# Patient Record
Sex: Female | Born: 1995 | Race: White | Hispanic: No | Marital: Single | Smoking: Never smoker
Health system: Southern US, Community
[De-identification: ages and names within clinical notes are randomized; demographics above are authoritative.]

## PROBLEM LIST (undated history)

## (undated) DIAGNOSIS — I471 Supraventricular tachycardia: Secondary | ICD-10-CM

---

## 2015-12-10 ENCOUNTER — Encounter: Payer: Self-pay | Admitting: Emergency Medicine

## 2015-12-10 ENCOUNTER — Other Ambulatory Visit: Payer: Self-pay

## 2015-12-10 ENCOUNTER — Emergency Department: Payer: 59

## 2015-12-10 ENCOUNTER — Emergency Department
Admission: EM | Admit: 2015-12-10 | Discharge: 2015-12-10 | Disposition: A | Discharge status: Home / Self Care | Payer: 59 | Attending: Emergency Medicine | Admitting: Emergency Medicine

## 2015-12-10 DIAGNOSIS — Z638 Other specified problems related to primary support group: Secondary | ICD-10-CM | POA: Insufficient documentation

## 2015-12-10 DIAGNOSIS — R0602 Shortness of breath: Secondary | ICD-10-CM | POA: Diagnosis present

## 2015-12-10 DIAGNOSIS — F419 Anxiety disorder, unspecified: Secondary | ICD-10-CM | POA: Diagnosis not present

## 2015-12-10 DIAGNOSIS — F439 Reaction to severe stress, unspecified: Secondary | ICD-10-CM

## 2015-12-10 DIAGNOSIS — I471 Supraventricular tachycardia: Secondary | ICD-10-CM | POA: Insufficient documentation

## 2015-12-10 DIAGNOSIS — F410 Panic disorder [episodic paroxysmal anxiety] without agoraphobia: Secondary | ICD-10-CM

## 2015-12-10 HISTORY — DX: Supraventricular tachycardia: I47.1

## 2015-12-10 LAB — BASIC METABOLIC PANEL
Anion gap: 7 (ref 5–15)
BUN: 17 mg/dL (ref 6–20)
CO2: 25 mmol/L (ref 22–32)
Calcium: 9.1 mg/dL (ref 8.9–10.3)
Chloride: 103 mmol/L (ref 101–111)
Creatinine, Ser: 0.73 mg/dL (ref 0.44–1.00)
GFR calc Af Amer: >60 mL/min (ref >60)
GFR calc non Af Amer: >60 mL/min (ref >60)
GLUCOSE: 94 mg/dL (ref 65–99)
POTASSIUM: 3.7 mmol/L (ref 3.5–5.1)
Sodium: 135 mmol/L (ref 135–145)

## 2015-12-10 LAB — TROPONIN I: Troponin I: <0.03 ng/mL (ref <0.031)

## 2015-12-10 LAB — CBC
HEMATOCRIT: 42.5 % (ref 35.0–47.0)
HEMOGLOBIN: 14.5 g/dL (ref 12.0–16.0)
MCH: 31.5 pg (ref 26.0–34.0)
MCHC: 34.2 g/dL (ref 32.0–36.0)
MCV: 92 fL (ref 80.0–100.0)
PLATELETS: 270 10*3/uL (ref 150–440)
RBC: 4.61 MIL/uL (ref 3.80–5.20)
RDW: 12.8 % (ref 11.5–14.5)
WBC: 12.9 10*3/uL — ABNORMAL HIGH (ref 3.6–11.0)

## 2015-12-10 MED ORDER — ALPRAZOLAM 0.25 MG PO TABS: 0.2500 mg | ORAL_TABLET | Freq: Three times a day (TID) | ORAL | Status: AC | PRN

## 2015-12-10 NOTE — ED Notes (Signed)
Pt presents to ED with complaints of shortness of breath, lightheadedness and dizziness. Pt states has been under a lot of stress recently and not sleeping well. Pt states slept very well last night and woke this morning feeling the same way. Pt states has a history of SVT so has been concerned.

## 2015-12-10 NOTE — ED Provider Notes (Signed)
[facility] Emergency Department Provider Note  ____________________________________________  Time seen: Approximately 11:08 AM  I have reviewed the triage vital signs and the nursing notes.   HISTORY  Chief Complaint Shortness of Breath    HPI Dominique Gay is a 20 y.o. female presents for evaluation of shortness of breath lightheadedness dizziness or chest pains. Past medical history of SVT so she is concerned about that. Patient states that she's been under a lot of stress at school lately and not sleeping well. Patient does report that she had been asleep last night but however woke up feeling anxious and stressed out. Denies any nausea vomiting numbness tingling or radiation of the chest pain.   Past Medical History  Diagnosis Date  . SVT (supraventricular tachycardia) (HCC)     There are no active problems to display for this patient.   History reviewed. No pertinent past surgical history.  Current Outpatient Rx  Name  Route  Sig  Dispense  Refill  . ALPRAZolam (XANAX) 0.25 MG tablet   Oral   Take 1 tablet (0.25 mg total) by mouth 3 (three) times daily as needed for anxiety.   30 tablet   0     Allergies Review of patient's allergies indicates no known allergies.  No family history on file.  Social History Social History  Substance Use Topics  . Smoking status: Never Smoker   . Smokeless tobacco: None  . Alcohol Use: Yes     Comment: occasional    Review of Systems Constitutional: No fever/chills Eyes: No visual changes. ENT: No sore throat. Cardiovascular: Positive chest pain. Respiratory: Positive shortness of breath. Gastrointestinal: No abdominal pain.  No nausea, no vomiting.  No diarrhea.  No constipation. Genitourinary: Negative for dysuria. Musculoskeletal: Negative for back pain. Skin: Negative for rash. Neurological: Negative for headaches, focal weakness or numbness. Positive for anxiety.  10-point ROS otherwise  negative.  ____________________________________________   PHYSICAL EXAM:  VITAL SIGNS: ED Triage Vitals  Enc Vitals Group     BP 12/10/15 0929 114/69 mmHg     Pulse Rate 12/10/15 0929 87     Resp 12/10/15 0929 15     Temp 12/10/15 0929 98.7 F (37.1 C)     Temp Source 12/10/15 0929 Oral     SpO2 12/10/15 0929 100 %     Weight 12/10/15 0929 110 lb (49.896 kg)     Height 12/10/15 0929 5\' 7"  (1.702 m)     Head Cir --      Peak Flow --      Pain Score --      Pain Loc --      Pain Edu? --      Excl. in GC? --     Constitutional: Alert and oriented. Well appearing and in no acute distress. Eyes: Conjunctivae are normal. PERRL. EOMI. Head: Atraumatic. Nose: No congestion/rhinnorhea. Mouth/Throat: Mucous membranes are moist.  Oropharynx non-erythematous. Neck: No stridor.   Cardiovascular: Normal rate, regular rhythm. Grossly normal heart sounds.  Good peripheral circulation. Respiratory: Normal respiratory effort.  No retractions. Lungs CTAB. Musculoskeletal: No lower extremity tenderness nor edema.  No joint effusions. Neurologic:  Normal speech and language. No gross focal neurologic deficits are appreciated. No gait instability. Skin:  Skin is warm, dry and intact. No rash noted. Psychiatric: Mood and affect are normal. Speech and behavior are normal.  ____________________________________________   LABS (all labs ordered are listed, but only abnormal results are displayed)  Labs Reviewed  CBC -  Abnormal; Notable for the following:    WBC 12.9 (*)    All other components within normal limits  BASIC METABOLIC PANEL  TROPONIN I   ____________________________________________  EKG  Normal sinus rhythm, no acute STEMI. ____________________________________________  RADIOLOGY  FINDINGS: Heart and mediastinal contours are within normal limits. No focal opacities or effusions. No acute bony abnormality.  IMPRESSION: No active cardiopulmonary  disease. ____________________________________________   PROCEDURES  Procedure(s) performed: None  Critical Care performed: No  ____________________________________________   INITIAL IMPRESSION / ASSESSMENT AND PLAN / ED COURSE  Pertinent labs & imaging results that were available during my care of the patient were reviewed by me and considered in my medical decision making (see chart for details).  Situational stress anxiety. Rx given for Xanax 0.25 mg 3 times a day as needed for anxiety. Establish local health care provider follow-up with student health. Patient voices no other emergency medical complaints at this time.  Reassurance given that chest pain or discomfort is not cardiac in nature. ____________________________________________   FINAL CLINICAL IMPRESSION(S) / ED DIAGNOSES  Final diagnoses:  Anxiety attack  Stress at home     This chart was dictated using voice recognition software/Dragon. Despite best efforts to proofread, errors can occur which can change the meaning. Any change was purely unintentional.   Evangeline Dakin, PA-C 12/10/15 1125

## 2015-12-10 NOTE — Discharge Instructions (Signed)
Stress and Stress Management °Stress is a normal reaction to life events. It is what you feel when life demands more than you are used to or more than you can handle. Some stress can be useful. For example, the stress reaction can help you catch the last bus of the day, study for a test, or meet a deadline at work. But stress that occurs too often or for too long can cause problems. It can affect your emotional health and interfere with relationships and normal daily activities. Too much stress can weaken your immune system and increase your risk for physical illness. If you already have a medical problem, stress can make it worse. °CAUSES  °All sorts of life events may cause stress. An event that causes stress for one person may not be stressful for another person. Major life events commonly cause stress. These may be positive or negative. Examples include losing your job, moving into a new home, getting married, having a baby, or losing a loved one. Less obvious life events may also cause stress, especially if they occur day after day or in combination. Examples include working long hours, driving in traffic, caring for children, being in debt, or being in a difficult relationship. °SIGNS AND SYMPTOMS °Stress may cause emotional symptoms including, the following: °· Anxiety. This is feeling worried, afraid, on edge, overwhelmed, or out of control. °· Anger. This is feeling irritated or impatient. °· Depression. This is feeling sad, down, helpless, or guilty. °· Difficulty focusing, remembering, or making decisions. °Stress may cause physical symptoms, including the following:  °· Aches and pains. These may affect your head, neck, back, stomach, or other areas of your body. °· Tight muscles or clenched jaw. °· Low energy or trouble sleeping.  °Stress may cause unhealthy behaviors, including the following:  °· Eating to feel better (overeating) or skipping meals. °· Sleeping too little, too much, or both. °· Working  too much or putting off tasks (procrastination). °· Smoking, drinking alcohol, or using drugs to feel better. °DIAGNOSIS  °Stress is diagnosed through an assessment by your health care provider. Your health care provider will ask questions about your symptoms and any stressful life events. Your health care provider will also ask about your medical history and may order blood tests or other tests. Certain medical conditions and medicine can cause physical symptoms similar to stress.  Mental illness can cause emotional symptoms and unhealthy behaviors similar to stress. Your health care provider may refer you to a mental health professional for further evaluation.  °TREATMENT  °Stress management is the recommended treatment for stress. The goals of stress management are reducing stressful life events and coping with stress in healthy ways.  °Techniques for reducing stressful life events include the following: °· Stress identification. Self-monitor for stress and identify what causes stress for you. These skills may help you to avoid some stressful events. °· Time management. Set your priorities, keep a calendar of events, and learn to say "no." These tools can help you avoid making too many commitments. °Techniques for coping with stress include the following: °· Rethinking the problem. Try to think realistically about stressful events rather than ignoring them or overreacting. Try to find the positives in a stressful situation rather than focusing on the negatives. °· Exercise. Physical exercise can release both physical and emotional tension. The key is to find a form of exercise you enjoy and do it regularly. °· Relaxation techniques. These relax the body and mind. Examples include yoga, meditation, tai chi, biofeedback, deep   breathing, progressive muscle relaxation, listening to music, being out in nature, journaling, and other hobbies. Again, the key is to find one or more that you enjoy and can do  regularly.  Healthy lifestyle. Eat a balanced diet, get plenty of sleep, and do not smoke. Avoid using alcohol or drugs to relax.  Strong support network. Spend time with family, friends, or other people you enjoy being around.Express your feelings and talk things over with someone you trust. Counseling or talktherapy with a mental health professional may be helpful if you are having difficulty managing stress on your own. Medicine is typically not recommended for the treatment of stress.Talk to your health care provider if you think you need medicine for symptoms of stress. HOME CARE INSTRUCTIONS  Keep all follow-up visits as directed by your health care provider.  Take all medicines as directed by your health care provider. SEEK MEDICAL CARE IF:  Your symptoms get worse or you start having new symptoms.  You feel overwhelmed by your problems and can no longer manage them on your own. SEEK IMMEDIATE MEDICAL CARE IF:  You feel like hurting yourself or someone else.   This information is not intended to replace advice given to you by your health care provider. Make sure you discuss any questions you have with your health care provider.   Document Released: 02/16/2001 Document Revised: 09/13/2014 Document Reviewed: 04/17/2013 Elsevier Interactive Patient Education 2016 [employer]  Panic Attacks Panic attacks are sudden, short-livedsurges of severe anxiety, fear, or discomfort. They may occur for no reason when you are relaxed, when you are anxious, or when you are sleeping. Panic attacks may occur for a number of reasons:   Healthy people occasionally have panic attacks in extreme, life-threatening situations, such as war or natural disasters. Normal anxiety is a protective mechanism of the body that helps [place] react to danger (fight or flight response).  Panic attacks are often seen with anxiety disorders, such as panic disorder, social anxiety disorder, generalized anxiety  disorder, and phobias. Anxiety disorders cause excessive or uncontrollable anxiety. They may interfere with your relationships or other life activities.  Panic attacks are sometimes seen with other mental illnesses, such as depression and posttraumatic stress disorder.  Certain medical conditions, prescription medicines, and drugs of abuse can cause panic attacks. SYMPTOMS  Panic attacks start suddenly, peak within 20 minutes, and are accompanied by four or more of the following symptoms:  Pounding heart or fast heart rate (palpitations).  Sweating.  Trembling or shaking.  Shortness of breath or feeling smothered.  Feeling choked.  Chest pain or discomfort.  Nausea or strange feeling in your stomach.  Dizziness, light-headedness, or feeling like you will faint.  Chills or hot flushes.  Numbness or tingling in your lips or hands and feet.  Feeling that things are not real or feeling that you are not yourself.  Fear of losing control or going crazy.  Fear of dying. Some of these symptoms can mimic serious medical conditions. For example, you may think you are having a heart attack. Although panic attacks can be very scary, they are not life threatening. DIAGNOSIS  Panic attacks are diagnosed through an assessment by your health care provider. Your health care provider will ask questions about your symptoms, such as where and when they occurred. Your health care provider will also ask about your medical history and use of alcohol and drugs, including prescription medicines. Your health care provider may order blood tests or other studies to rule  out a serious medical condition. Your health care provider may refer you to a mental health professional for further evaluation. TREATMENT   Most healthy people who have one or two panic attacks in an extreme, life-threatening situation will not require treatment.  The treatment for panic attacks associated with anxiety disorders or other  mental illness typically involves counseling with a mental health professional, medicine, or a combination of both. Your health care provider will help determine what treatment is best for you.  Panic attacks due to physical illness usually go away with treatment of the illness. If prescription medicine is causing panic attacks, talk with your health care provider about stopping the medicine, decreasing the dose, or substituting another medicine.  Panic attacks due to alcohol or drug abuse go away with abstinence. Some adults need professional help in order to stop drinking or using drugs. HOME CARE INSTRUCTIONS   Take all medicines as directed by your health care provider.   Schedule and attend follow-up visits as directed by your health care provider. It is important to keep all your appointments. SEEK MEDICAL CARE IF:  You are not able to take your medicines as prescribed.  Your symptoms do not improve or get worse. SEEK IMMEDIATE MEDICAL CARE IF:   You experience panic attack symptoms that are different than your usual symptoms.  You have serious thoughts about hurting yourself or others.  You are taking medicine for panic attacks and have a serious side effect. MAKE SURE YOU:  Understand these instructions.  Will watch your condition.  Will get help right away if you are not doing well or get worse.   This information is not intended to replace advice given to you by your health care provider. Make sure you discuss any questions you have with your health care provider.   Document Released: 08/23/2005 Document Revised: 08/28/2013 Document Reviewed: 04/06/2013 Elsevier Interactive Patient Education [employer].

## 2017-10-29 IMAGING — CR DG CHEST 2V
1 series · 2 of 2 positions shown · non-contrast
Comparison: None.

CLINICAL DATA: Shortness of Breath, heart racing.  Lightheaded.

EXAM:
CHEST  2 VIEW

[Series 1: dg chest 2 view · 0.14mm/px · 2 of 2 slices shown]
[im 1/2]
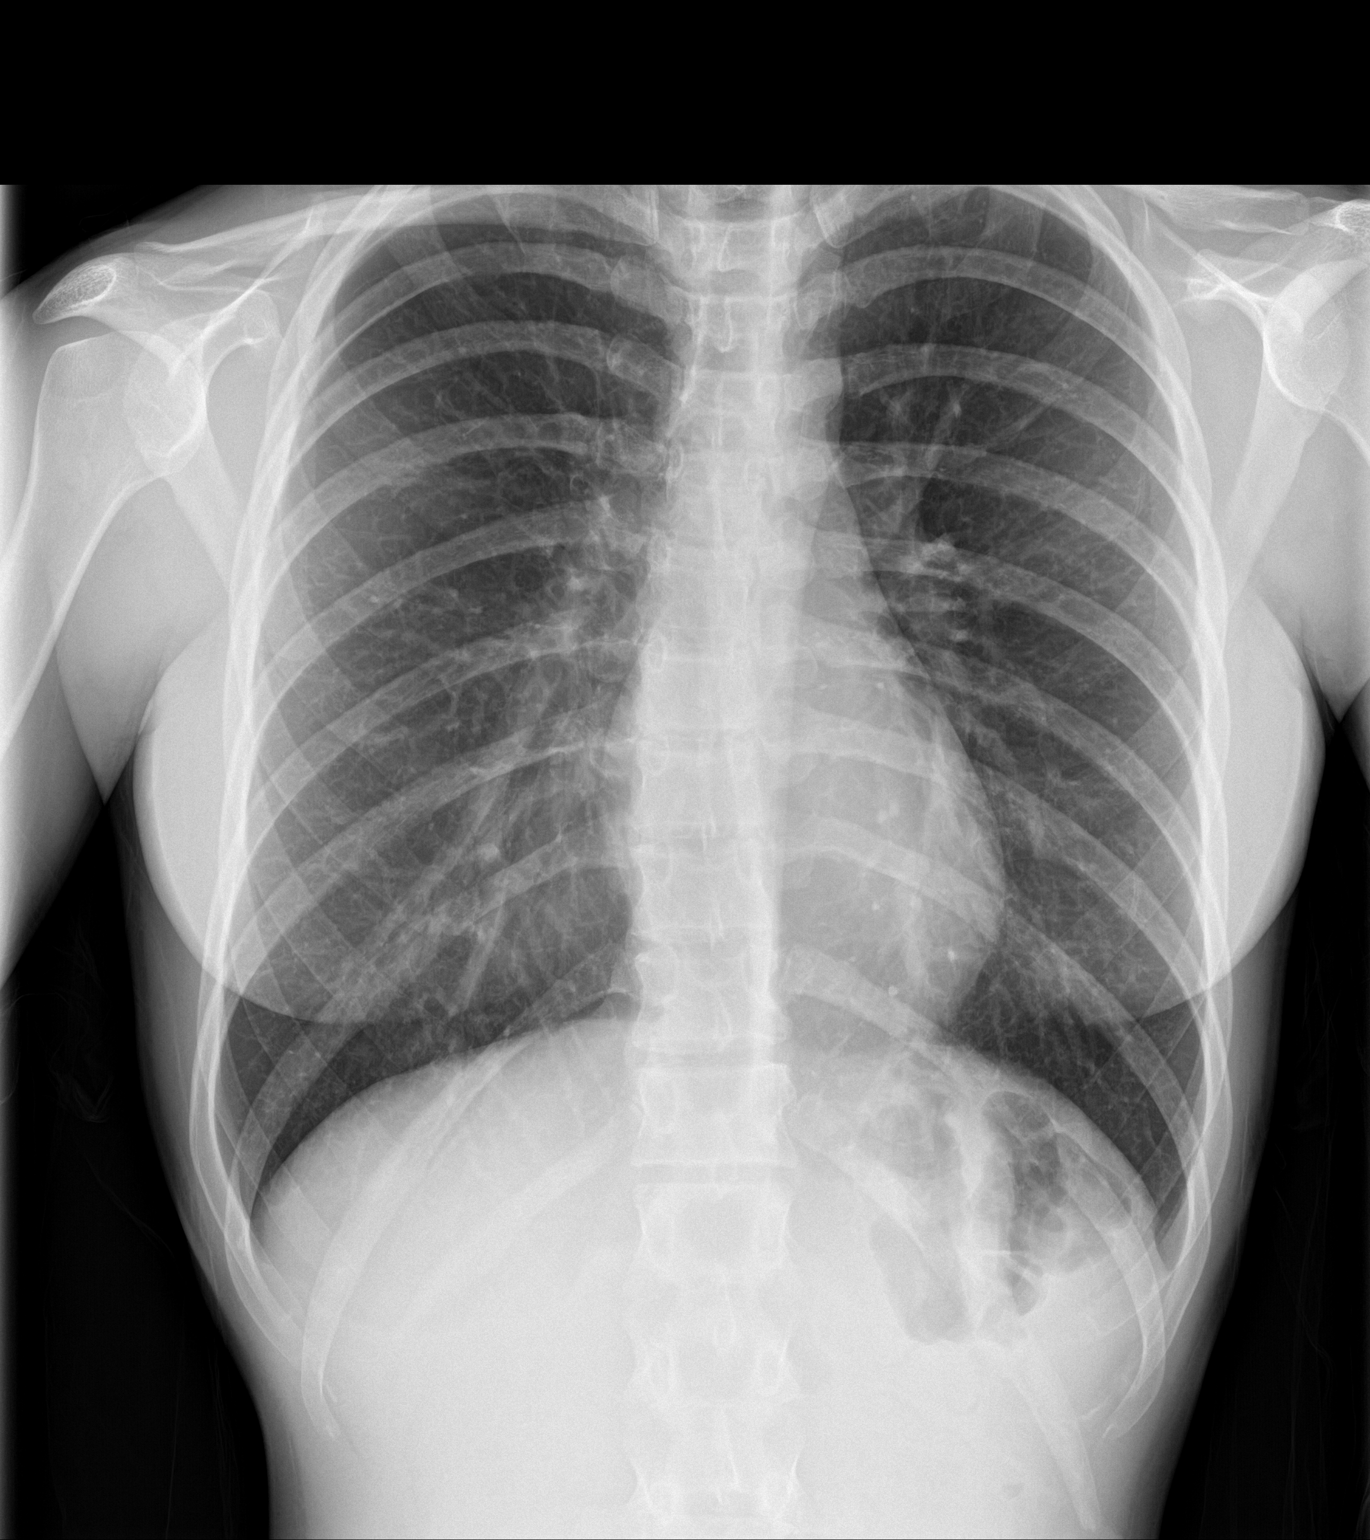
[im 2/2]
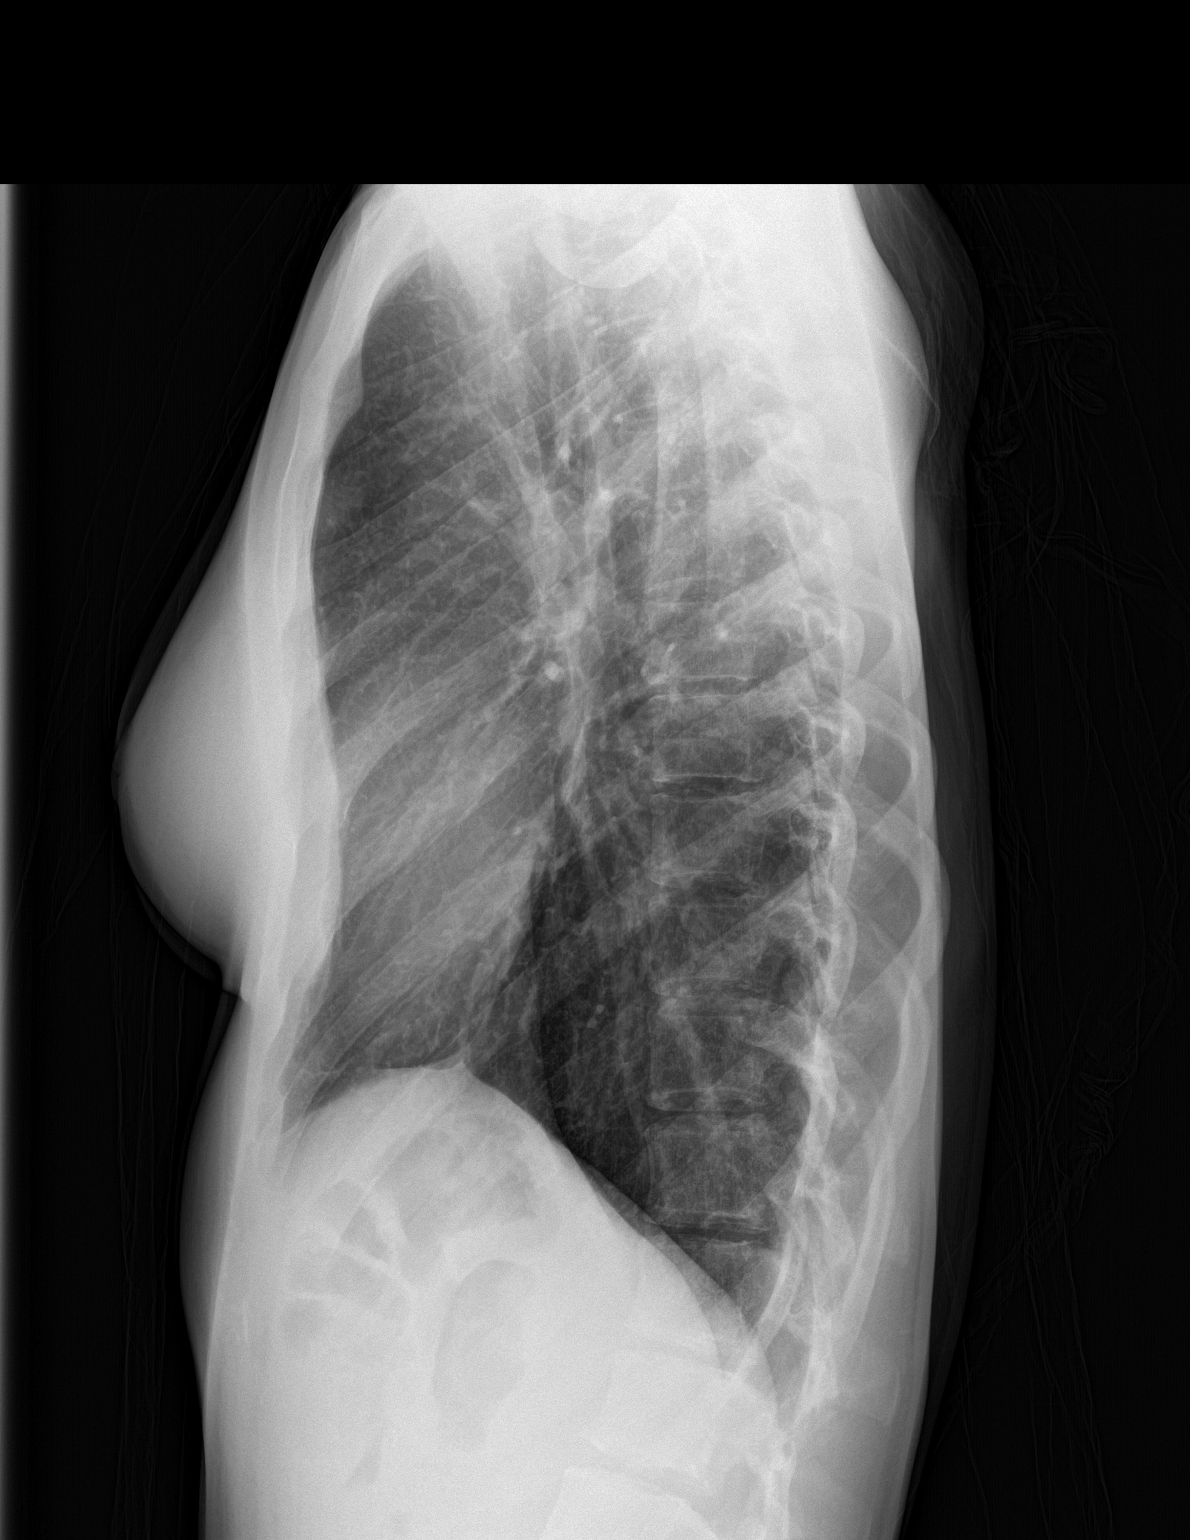

[2 of 2 positions shown; findings below may reference images not displayed]

FINDINGS: Heart and mediastinal contours are within normal limits. No focal
opacities or effusions. No acute bony abnormality.
IMPRESSION: No active cardiopulmonary disease.
